# Patient Record
Sex: Female | Born: 1980
Health system: Southern US, Community
[De-identification: ages and names within clinical notes are randomized; demographics above are authoritative.]

## PROBLEM LIST (undated history)

## (undated) HISTORY — PX: OTHER SURGICAL HISTORY: SHX169

## (undated) HISTORY — PX: TONSILECTOMY/ADENOIDECTOMY WITH MYRINGOTOMY: SHX6125

---

## 2001-10-21 HISTORY — PX: ESOPHAGOGASTRODUODENOSCOPY: SHX1529

## 2013-01-05 HISTORY — PX: NASAL SINUS SURGERY: SHX719

## 2015-01-06 NOTE — L&D Delivery Note (Signed)
Delivery Note At 07:52 am a viable female, "[name]",was delivered via Vaginal, Spontaneous Delivery (Presentation: OA restituting to LOA). APGARS: 8, 8; weight pending.  Placenta status: Spontaneous, intact.Cord:with the following complications: Nuchal x 1; infant somersaulted through. Cord pH: NA  Anesthesia: Local for repair Episiotomy: NA Lacerations: Small 2nd degree vaginal Suture Repair: 3-0 chromic SH Est. Blood Loss (mL): 300  Mom to postpartum. Baby to Couplet care / Skin to Skin.  Mom plans to breastfeed.   Birth control not discussed.    Diana ScarletKimberly Macenzie Forbes, CNM 08/25/15, 8:58 AM

## 2015-07-31 LAB — OB RESULTS CONSOLE GBS: STREP GROUP B AG: POSITIVE

## 2015-08-25 ENCOUNTER — Encounter (HOSPITAL_COMMUNITY): Payer: Self-pay

## 2015-08-25 ENCOUNTER — Inpatient Hospital Stay (HOSPITAL_COMMUNITY)
Admission: AD | Admit: 2015-08-25 | Discharge: 2015-08-27 | DRG: 775 | Disposition: A | Discharge status: Home / Self Care | Payer: BLUE CROSS/BLUE SHIELD | Source: Ambulatory Visit | Attending: Obstetrics and Gynecology | Admitting: Obstetrics and Gynecology

## 2015-08-25 DIAGNOSIS — O09529 Supervision of elderly multigravida, unspecified trimester: Secondary | ICD-10-CM

## 2015-08-25 DIAGNOSIS — O99824 Streptococcus B carrier state complicating childbirth: Secondary | ICD-10-CM | POA: Diagnosis present

## 2015-08-25 DIAGNOSIS — Z6832 Body mass index (BMI) 32.0-32.9, adult: Secondary | ICD-10-CM | POA: Diagnosis not present

## 2015-08-25 DIAGNOSIS — Z3A39 39 weeks gestation of pregnancy: Secondary | ICD-10-CM

## 2015-08-25 DIAGNOSIS — E669 Obesity, unspecified: Secondary | ICD-10-CM | POA: Diagnosis present

## 2015-08-25 DIAGNOSIS — B951 Streptococcus, group B, as the cause of diseases classified elsewhere: Secondary | ICD-10-CM

## 2015-08-25 DIAGNOSIS — O99214 Obesity complicating childbirth: Secondary | ICD-10-CM | POA: Diagnosis present

## 2015-08-25 DIAGNOSIS — Z880 Allergy status to penicillin: Secondary | ICD-10-CM | POA: Diagnosis not present

## 2015-08-25 DIAGNOSIS — Z3483 Encounter for supervision of other normal pregnancy, third trimester: Secondary | ICD-10-CM | POA: Diagnosis present

## 2015-08-25 LAB — CBC
HEMATOCRIT: 37.5 % (ref 36.0–46.0)
Hemoglobin: 12.9 g/dL (ref 12.0–15.0)
MCH: 28.7 pg (ref 26.0–34.0)
MCHC: 34.4 g/dL (ref 30.0–36.0)
MCV: 83.5 fL (ref 78.0–100.0)
PLATELETS: 192 10*3/uL (ref 150–400)
RBC: 4.49 MIL/uL (ref 3.87–5.11)
RDW: 14.5 % (ref 11.5–15.5)
WBC: 11.7 10*3/uL — AB (ref 4.0–10.5)

## 2015-08-25 LAB — ABO/RH: ABO/RH(D): O POS

## 2015-08-25 LAB — TYPE AND SCREEN
ABO/RH(D): O POS
ANTIBODY SCREEN: NEGATIVE

## 2015-08-25 LAB — RPR: RPR Ser Ql: NONREACTIVE

## 2015-08-25 MED ORDER — OXYCODONE-ACETAMINOPHEN 5-325 MG PO TABS: 2.0000 | ORAL_TABLET | ORAL | Status: DC | PRN

## 2015-08-25 MED ORDER — LIDOCAINE HCL (PF) 1 % IJ SOLN
30.0000 mL | INTRAMUSCULAR | Status: AC | PRN
  Administered 2015-08-25: 30 mL via SUBCUTANEOUS
  Filled 2015-08-25: qty 30

## 2015-08-25 MED ORDER — MAGNESIUM HYDROXIDE 400 MG/5ML PO SUSP: 30.0000 mL | ORAL | Status: DC | PRN

## 2015-08-25 MED ORDER — METHYLERGONOVINE MALEATE 0.2 MG/ML IJ SOLN: 0.2000 mg | INTRAMUSCULAR | Status: DC | PRN

## 2015-08-25 MED ORDER — DIBUCAINE 1 % RE OINT: 1.0000 "application " | TOPICAL_OINTMENT | RECTAL | Status: DC | PRN

## 2015-08-25 MED ORDER — FLEET ENEMA 7-19 GM/118ML RE ENEM: 1.0000 | ENEMA | RECTAL | Status: DC | PRN

## 2015-08-25 MED ORDER — NALBUPHINE HCL 10 MG/ML IJ SOLN: 10.0000 mg | INTRAMUSCULAR | Status: DC | PRN

## 2015-08-25 MED ORDER — OXYCODONE-ACETAMINOPHEN 5-325 MG PO TABS: 1.0000 | ORAL_TABLET | ORAL | Status: DC | PRN

## 2015-08-25 MED ORDER — ONDANSETRON HCL 4 MG PO TABS
4.0000 mg | ORAL_TABLET | ORAL | Status: DC | PRN
Start: 2015-08-25 — End: 2015-08-27

## 2015-08-25 MED ORDER — OXYTOCIN 40 UNITS IN LACTATED RINGERS INFUSION - SIMPLE MED
2.5000 [IU]/h | INTRAVENOUS | Status: DC
  Filled 2015-08-25: qty 1000

## 2015-08-25 MED ORDER — ACETAMINOPHEN 325 MG PO TABS: 650.0000 mg | ORAL_TABLET | ORAL | Status: DC | PRN

## 2015-08-25 MED ORDER — VANCOMYCIN HCL IN DEXTROSE 1-5 GM/200ML-% IV SOLN
1000.0000 mg | Freq: Two times a day (BID) | INTRAVENOUS | Status: DC
  Administered 2015-08-25: 1000 mg via INTRAVENOUS
  Filled 2015-08-25: qty 200

## 2015-08-25 MED ORDER — ONDANSETRON HCL 4 MG/2ML IJ SOLN
4.0000 mg | INTRAMUSCULAR | Status: DC | PRN
Start: 2015-08-25 — End: 2015-08-27

## 2015-08-25 MED ORDER — TETANUS-DIPHTH-ACELL PERTUSSIS 5-2.5-18.5 LF-MCG/0.5 IM SUSP
0.5000 mL | Freq: Once | INTRAMUSCULAR | Status: AC
  Administered 2015-08-26: 0.5 mL via INTRAMUSCULAR
  Filled 2015-08-25: qty 0.5

## 2015-08-25 MED ORDER — BENZOCAINE-MENTHOL 20-0.5 % EX AERO
1.0000 "application " | INHALATION_SPRAY | CUTANEOUS | Status: DC | PRN
  Administered 2015-08-25: 1 via TOPICAL
  Filled 2015-08-25: qty 56

## 2015-08-25 MED ORDER — METHYLERGONOVINE MALEATE 0.2 MG PO TABS: 0.2000 mg | ORAL_TABLET | ORAL | Status: DC | PRN

## 2015-08-25 MED ORDER — OXYTOCIN BOLUS FROM INFUSION
500.0000 mL | Freq: Once | INTRAVENOUS | Status: AC
  Administered 2015-08-25: 500 mL via INTRAVENOUS

## 2015-08-25 MED ORDER — MEASLES, MUMPS & RUBELLA VAC ~~LOC~~ INJ
0.5000 mL | INJECTION | Freq: Once | SUBCUTANEOUS | Status: DC
  Filled 2015-08-25: qty 0.5

## 2015-08-25 MED ORDER — ZOLPIDEM TARTRATE 5 MG PO TABS: 5.0000 mg | ORAL_TABLET | Freq: Every evening | ORAL | Status: DC | PRN

## 2015-08-25 MED ORDER — DIPHENHYDRAMINE HCL 25 MG PO CAPS: 25.0000 mg | ORAL_CAPSULE | Freq: Four times a day (QID) | ORAL | Status: DC | PRN

## 2015-08-25 MED ORDER — SENNOSIDES-DOCUSATE SODIUM 8.6-50 MG PO TABS
2.0000 | ORAL_TABLET | ORAL | Status: DC
  Administered 2015-08-26 (×2): 2 via ORAL
  Filled 2015-08-25 (×2): qty 2

## 2015-08-25 MED ORDER — SOD CITRATE-CITRIC ACID 500-334 MG/5ML PO SOLN: 30.0000 mL | ORAL | Status: DC | PRN

## 2015-08-25 MED ORDER — ONDANSETRON HCL 4 MG/2ML IJ SOLN: 4.0000 mg | Freq: Four times a day (QID) | INTRAMUSCULAR | Status: DC | PRN

## 2015-08-25 MED ORDER — HYDROXYZINE HCL 50 MG PO TABS
50.0000 mg | ORAL_TABLET | Freq: Four times a day (QID) | ORAL | Status: DC | PRN
  Filled 2015-08-25: qty 1

## 2015-08-25 MED ORDER — PRENATAL MULTIVITAMIN CH
1.0000 | ORAL_TABLET | Freq: Every day | ORAL | Status: DC
  Administered 2015-08-25 – 2015-08-26 (×2): 1 via ORAL
  Filled 2015-08-25 (×2): qty 1

## 2015-08-25 MED ORDER — LACTATED RINGERS IV SOLN
INTRAVENOUS | Status: DC
  Administered 2015-08-25: 125 mL/h via INTRAVENOUS

## 2015-08-25 MED ORDER — COCONUT OIL OIL: 1.0000 "application " | TOPICAL_OIL | Status: DC | PRN

## 2015-08-25 MED ORDER — SIMETHICONE 80 MG PO CHEW: 80.0000 mg | CHEWABLE_TABLET | ORAL | Status: DC | PRN

## 2015-08-25 MED ORDER — OXYTOCIN 40 UNITS IN LACTATED RINGERS INFUSION - SIMPLE MED: 2.5000 [IU]/h | INTRAVENOUS | Status: DC | PRN

## 2015-08-25 MED ORDER — DOCUSATE SODIUM 100 MG PO CAPS
100.0000 mg | ORAL_CAPSULE | Freq: Two times a day (BID) | ORAL | Status: DC
  Administered 2015-08-26: 100 mg via ORAL
  Filled 2015-08-25 (×3): qty 1

## 2015-08-25 MED ORDER — LACTATED RINGERS IV SOLN: 500.0000 mL | INTRAVENOUS | Status: DC | PRN

## 2015-08-25 MED ORDER — WITCH HAZEL-GLYCERIN EX PADS: 1.0000 "application " | MEDICATED_PAD | CUTANEOUS | Status: DC | PRN

## 2015-08-25 MED ORDER — IBUPROFEN 600 MG PO TABS
600.0000 mg | ORAL_TABLET | Freq: Four times a day (QID) | ORAL | Status: DC
  Administered 2015-08-25 – 2015-08-27 (×8): 600 mg via ORAL
  Filled 2015-08-25 (×8): qty 1

## 2015-08-25 NOTE — H&P (Signed)
Diana SchwabMisty Su Forbes is a 35 y.o. female, G2P1001 at 39.6 wks (as dated by LMP c/w 20.0 wk us), presents w/ c/o ctxs since yesterday, about every 5 mins. Denies LOF. Some bloody mucous.  Pt entered care with [facility] at 8.4 wks, [facility]  Prenatal course significant for:  1) AMA. Declined genetic testing. 2) GBS positive. 3) PCN allergy - hives - Sensitive to Vancomycin, resistant to Clindamycin. 4) Partial placental previa - RESOLVED at 30 wks. 5) Obesity.   OB History    Gravida Para Term Preterm AB Living   2 1 1          SAB TAB Ectopic Multiple Live Births                 SVB on 12/26/2010 @ 40 wks; female infant, birthwt 7+5   History reviewed. No pertinent past medical history. Past Surgical History:  Procedure Laterality Date  . TONSILECTOMY/ADENOIDECTOMY WITH MYRINGOTOMY    . wisom teeth     Family History: family history is not on file. Social History:  reports that she has never smoked. She has never used smokeless tobacco. She reports that she does not drink alcohol or use drugs.     Maternal Diabetes: No Genetic Screening: Declined Maternal Ultrasounds/Referrals: Abnormal:  Findings:   Other:Partial placenta previa - RESOLVED AT 30 WKS Fetal Ultrasounds or other Referrals:  None Maternal Substance Abuse:  No Significant Maternal Medications:  Meds include: Other: Nystatin-triamcinolone, PNV, Probiotic, Vitamin D3, Vit K2 Significant Maternal Lab Results:  Lab values include: Group B Strep positive Other Comments:  Hbg 11.5 at 28 wks  ROS 10 Systems reviewed and are negative for acute change except as noted in the HPI.  History Dilation: 4.5 Effacement (%): 70 Station: -2, -1 Exam by:: Diana Enganielle Simpson RN @ 04:12  Blood pressure 130/88, pulse 90, temperature 98.2 F (36.8 C), temperature source Oral, resp. rate 16, height 5\' 5"  (1.651 m), weight 89.8 kg (198 lb), SpO2 99 %.   Exam  Cephalic by Leopold's EFW: 7 1/4 lbs; pelvis proven to  7+5  Physical Exam  Nursing noteand vitalsreviewed. Neurological: She has normal reflexes.  Constitutional: She is oriented to person, place, and time. She appears well-developed and well-nourished.  HENT:  Head: Normocephalic and atraumatic.  Neck: Normal range of motion.  Cardiovascular: Normal rate, regular rhythm and normal heart sounds.  Respiratory: Effort normal and breath sounds normal.  GI: Soft. Bowel sounds are normal. Abdomen is gravid.  Neurological: She is alert and oriented to person, place, and time.  Skin: Skin is warm and dry.  Musculoskeletal: Exhibits 1+ lower extremity edema. Psychiatric: She has a normal mood and affect. Her behavior is normal.   BL FHR 120s w/ moderate variability, +accels, no decels Toco: q 3-4 min  Results for orders placed or performed during the hospital encounter of 08/25/15 (from the past 24 hour(s))  CBC     Status: Abnormal   Collection Time: 08/25/15  4:25 AM  Result Value Ref Range   WBC 11.7 (H) 4.0 - 10.5 K/uL   RBC 4.49 3.87 - 5.11 MIL/uL   Hemoglobin 12.9 12.0 - 15.0 g/dL   HCT 62.937.5 52.836.0 - 41.346.0 %   MCV 83.5 78.0 - 100.0 fL   MCH 28.7 26.0 - 34.0 pg   MCHC 34.4 30.0 - 36.0 g/dL   RDW 24.414.5 01.011.5 - 27.215.5 %   Platelets 192 150 - 400 K/uL  Type and screen [facility]  Status: None   Collection Time: 08/25/15  4:25 AM  Result Value Ref Range   ABO/RH(D) O POS    Antibody Screen NEG    Sample Expiration 08/28/2015    Prenatal labs: ABO, Rh: --/--/O POS (08/20 0425) Antibody: NEG (08/20 0425) Rubella: Immune (01/18/2015) RPR: NR (01/18/2015) HBsAg: Neg (01/18/2015) HIV: Neg (01/18/2015) GBS: Positive (07/26 0000)   Assessment: IUP at 39.6 wks Latent phase labor FWB: Cat 1 GBS positive AMA Obesity (BMI 32.6)  [facility] Routine [facility] orders.  Expectant management for now. Vancomycin for GBS positive status. Pain med/epidural prn. Expect SVD.     [name],  [name] 08/25/2015, 5:57 AM

## 2015-08-25 NOTE — MAU Note (Signed)
Pt c/o contractions since yesterday about every 5 mins. Denies LOF. Some bloody mucous. +FM. Cervix has not been checked.

## 2015-08-25 NOTE — Anesthesia Pain Management Evaluation Note (Signed)
  CRNA Pain Management Visit Note  Patient: [name], 35 y.o., female  "Hello I am a [facility] the [facility]. We have an anesthesia team available at all times to provide care throughout the hospital, including epidural management and anesthesia for C-section. I don't know your plan for the delivery whether it a natural birth, water birth, IV sedation, nitrous supplementation, doula or epidural, but we want to meet your pain goals."   1.Was your pain managed to your expectations on prior hospitalizations?   Yes   2.What is your expectation for pain management during this hospitalization?     Nitrous Oxide  3.How can we help you reach that goal? N20  Record the patient's initial score and the patient's pain goal.   Pain: 6  Pain Goal: 9 [facility] wants you to be able to say your pain was always managed very well.  Shaheen Mende 08/25/2015

## 2015-08-26 LAB — CBC
HEMATOCRIT: 34.9 % — AB (ref 36.0–46.0)
HEMOGLOBIN: 11.7 g/dL — AB (ref 12.0–15.0)
MCH: 28.4 pg (ref 26.0–34.0)
MCHC: 33.5 g/dL (ref 30.0–36.0)
MCV: 84.7 fL (ref 78.0–100.0)
Platelets: 162 10*3/uL (ref 150–400)
RBC: 4.12 MIL/uL (ref 3.87–5.11)
RDW: 14.7 % (ref 11.5–15.5)
WBC: 13.5 10*3/uL — ABNORMAL HIGH (ref 4.0–10.5)

## 2015-08-26 NOTE — Lactation Note (Signed)
This note was copied from a baby's chart. Lactation Consultation Note Experienced BF mom BF her now 35 yr old for 13 months. Denies difficulty. States this baby is BF well. Latching well. Asked for tips on BF.  Patient Name: Girl Fredric MareMisty Beevers Mom encouraged to feed baby 8-12 times/24 hours and with feeding cues. Referred to Baby and Me Book in Breastfeeding section Pg. 22-23 for position options and Proper latch demonstration.Encouraged comfort during BF so colostrum flows better and mom will enjoy the feeding longer. Taking deep breaths and breast massage during BF.  Educated about newborn behavior, STS, I&O, cluster feeding. WH/LC brochure given w/resources, support groups and LC services. Today's Date: 08/26/2015 Reason for consult: Initial assessment   Maternal Data Has patient been taught Hand Expression?: Yes Does the patient have breastfeeding experience prior to this delivery?: Yes  Feeding Feeding Type: Breast Fed Length of feed: 20 min  LATCH Score/Interventions                Intervention(s): Breastfeeding basics reviewed;Support Pillows;Position options;Skin to skin     Lactation Tools Discussed/Used     Consult Status Consult Status: Follow-up Date: 08/27/15 Follow-up type: In-patient    Bert Ptacek, Diamond NickelLAURA G 08/26/2015, 2:05 AM

## 2015-08-26 NOTE — Progress Notes (Signed)
[name], [name] Female, 35 y.o., 01/26/1980  Post Partum Day1   Subjective: no complaints  Objective: Blood pressure 130/76, pulse 72, temperature 98.3 F (36.8 C), temperature source Oral, resp. rate 18, height 5\' 5"  (1.651 m), weight 89.8 kg (198 lb), SpO2 99 %, unknown if currently breastfeeding.  Physical Exam:  General: alert, cooperative and no distress Lochia: appropriate Uterine Fundus: firm DVT Evaluation: No evidence of DVT seen on physical exam. Calf/Ankle edema is present, 2+ in right foot, 1+ in left foot.    Recent Labs  08/25/15 0425 08/26/15 0529  HGB 12.9 11.7*  HCT 37.5 34.9*    Assessment/Plan: Plan for discharge tomorrow, Breastfeeding and Contraception plan is progesterone only pills.    LOS: 1 day   [facility] The[facility]UL[facility],Reyce [facility] 08/26/2015, 12:32 PM

## 2015-08-26 NOTE — Discharge Instructions (Signed)

## 2015-08-26 NOTE — Discharge Summary (Signed)
[facility]   Patient Name:   [name] DOB:     Sep 26, 1980 MRN:     [id number]  Date of Admission:   08/25/2015 Date of Discharge:  08/27/2015  Admitting diagnosis:    [id number] Principal Problem:   Vaginal delivery Active Problems:   Penicillin allergy - hives   Positive GBS test   AMA (advanced maternal age) multigravida 35+   Second-degree perineal laceration, with delivery      Discharge diagnosis:    [id number] Principal Problem:   Vaginal delivery Active Problems:   Penicillin allergy - hives   Positive GBS test   AMA (advanced maternal age) multigravida 35+   Second-degree perineal laceration, with delivery                                                                       Post partum procedures: NA  Type of Delivery:  SVB  Delivering Provider: Sherre ScarletWILLIAMS, KIMBERLY   Date of Delivery:  08/25/15  Newborn Data:    Live born female  Birth Weight: 7 lb (3175 g) APGAR: 8, 8  Baby's Name:  [name] Baby Feeding:   Breast Disposition:   home with mother  Complications:   None  Hospital course:      Onset of Labor With Vaginal Delivery     35 y.o. yo G2P2001 at 7451w6d was admitted in Latent Labor on 08/25/2015. Patient had an uncomplicated labor course as follows:  Membrane Rupture Time/Date: 7:36 AM ,08/25/2015   Intrapartum Procedures: Episiotomy: None [1]                                         Lacerations:  2nd degree [3];Perineal [11]  Patient had a delivery of a Viable infant. 08/25/2015  Information for the patient's newborn:  [name] [[id number]][[id number]]  Delivery Method: Vaginal, Spontaneous Delivery (Filed from Delivery Summary)    Pateint had an uncomplicated postpartum course.  She is ambulating, tolerating a regular diet, passing flatus, and urinating well. Patient is discharged home in stable condition on 08/27/15.    Physical Exam:   Vitals:   08/26/15 0500 08/26/15 1739  BP: 130/76 124/76   Pulse: 72 72  Resp: 18 18  Temp:  98.1 F (36.7 C)    General: alert Lochia: appropriate Uterine Fundus: firm Incision: Healing well with no significant drainage DVT Evaluation: No evidence of DVT seen on physical exam. Negative Homan's sign.  Labs:  CBC Latest Ref Rng & Units 08/26/2015 08/25/2015  WBC 4.0 - 10.5 K/uL 13.5(H) 11.7(H)  Hemoglobin 12.0 - 15.0 g/dL 11.7(L) 12.9  Hematocrit 36.0 - 46.0 % 34.9(L) 37.5  Platelets 150 - 400 K/uL 162 192     Discharge instruction: per After Visit Summary and "Baby and Me Booklet".  After Visit Meds:    Medication List    TAKE these medications   ibuprofen 600 MG tablet Commonly known as:  ADVIL,MOTRIN Take 1 tablet (600 mg total) by mouth every 6 (six) hours as needed.   norethindrone 0.35 MG tablet Commonly known as:  ORTHO MICRONOR Take 1 tablet (0.35 mg total) by mouth daily.  Start taking on:  09/15/2015   prenatal multivitamin Tabs tablet Take 1 tablet by mouth daily at 12 noon.       Diet: routine diet  Activity: Advance as tolerated. Pelvic rest for 6 weeks.   Outpatient follow up:6 weeks Follow up Appt:No future appointments. Follow up visit: No Follow-up on file.  Postpartum contraception: Progesterone only pills and Combination OCPs  08/27/2015 [name], CNM

## 2015-08-26 NOTE — Lactation Note (Signed)
This note was copied from a baby's chart. Lactation Consultation Note Follow up visit at 36 hours.  Mom is latching baby now to left breast in cross cradle hold.  Lc assisted with flanging lower lip and mom reports improved comfort.  Baby has fed very frequently and mom is a little sore.  No trauma noted to nipples at this time.  Audible swallows noted during entire feeding, encouraged mom to break latch when baby was not maintaining sucking and more relaxed.  Nipple slightly pinched with compression stripe on tip.  Nipple is not bruised at this time.  LC encouraged mom to work on breaking suction better and not allowing baby to pull nipple.  Baby then laid on moms chest asleep.  Baby is feeding well and may need feedings limited to active feeding time to prevent further soreness. Mom is hand expressing and applying to nipples.   Mom will have 16 weeks leave before returning to work.  Encouraged on demand feedings to establish a good supply and the pumping to have supply later.  Mom reports using fenugreek and waiting to pump until closer to returning to work with her older child.  Mom denies further concerns at this time.  FOB at bedside supportive.    Patient Name: [name] ZOXWR'UToday's Date: 08/26/2015 Reason for consult: Follow-up assessment   Maternal Data    Feeding Feeding Type: Breast Fed Length of feed: 7 min  LATCH Score/Interventions Latch: Grasps breast easily, tongue down, lips flanged, rhythmical sucking. Intervention(s): Assist with latch;Breast compression  Audible Swallowing: Spontaneous and intermittent Intervention(s): Skin to skin  Type of Nipple: Everted at rest and after stimulation  Comfort (Breast/Nipple): Filling, red/small blisters or bruises, mild/mod discomfort  Problem noted: Mild/Moderate discomfort Interventions (Mild/moderate discomfort): Hand expression  Hold (Positioning): No assistance needed to correctly position infant at  breast. Intervention(s): Breastfeeding basics reviewed;Skin to skin  LATCH Score: 9  Lactation Tools Discussed/Used     Consult Status Consult Status: Follow-up Date: 08/27/15 Follow-up type: In-patient    [name], [name] 08/26/2015, 8:31 PM

## 2015-08-27 MED ORDER — NORETHINDRONE 0.35 MG PO TABS: 1.0000 | ORAL_TABLET | Freq: Every day | ORAL | 11 refills | Status: DC

## 2015-08-27 MED ORDER — IBUPROFEN 600 MG PO TABS: 600.0000 mg | ORAL_TABLET | Freq: Four times a day (QID) | ORAL | 2 refills | Status: DC | PRN

## 2015-08-27 NOTE — Lactation Note (Signed)
This note was copied from a baby's chart. Lactation Consultation Note: Experienced BF mom reports baby has been nursing well. Concerned about weight loss. Reassurance given. Reports breasts are feeling fuller this morning. Baby last fed about 1 hour ago for 25 min and is asleep on mom's chest. Has Medela pump for home. Back to work after 3 months. Encouragement given. Reviewed our phone number to call with questions. OP appointments and BFSG .   Patient Name: [name] NFAOZ'HToday's Date: 08/27/2015 Reason for consult: Follow-up assessment   Maternal Data Formula Feeding for Exclusion: No Has patient been taught Hand Expression?: Yes Does the patient have breastfeeding experience prior to this delivery?: Yes  Feeding Feeding Type: Breast Fed Length of feed: 25 min  LATCH Score/Interventions                      Lactation Tools Discussed/Used WIC Program: No   Consult Status Consult Status: Complete    Pamelia HoitWeeks, Maguire Killmer D 08/27/2015, 8:20 AM

## 2017-04-12 ENCOUNTER — Emergency Department (HOSPITAL_BASED_OUTPATIENT_CLINIC_OR_DEPARTMENT_OTHER): Payer: BLUE CROSS/BLUE SHIELD

## 2017-04-12 ENCOUNTER — Emergency Department (HOSPITAL_BASED_OUTPATIENT_CLINIC_OR_DEPARTMENT_OTHER)
Admission: EM | Admit: 2017-04-12 | Discharge: 2017-04-12 | Disposition: A | Discharge status: Home / Self Care | Payer: BLUE CROSS/BLUE SHIELD | Attending: Emergency Medicine | Admitting: Emergency Medicine

## 2017-04-12 ENCOUNTER — Encounter (HOSPITAL_BASED_OUTPATIENT_CLINIC_OR_DEPARTMENT_OTHER): Payer: Self-pay | Admitting: *Deleted

## 2017-04-12 ENCOUNTER — Other Ambulatory Visit: Payer: Self-pay

## 2017-04-12 DIAGNOSIS — D72829 Elevated white blood cell count, unspecified: Secondary | ICD-10-CM | POA: Insufficient documentation

## 2017-04-12 DIAGNOSIS — K7689 Other specified diseases of liver: Secondary | ICD-10-CM | POA: Insufficient documentation

## 2017-04-12 DIAGNOSIS — R109 Unspecified abdominal pain: Secondary | ICD-10-CM | POA: Diagnosis not present

## 2017-04-12 DIAGNOSIS — R197 Diarrhea, unspecified: Secondary | ICD-10-CM | POA: Diagnosis not present

## 2017-04-12 DIAGNOSIS — R1013 Epigastric pain: Secondary | ICD-10-CM | POA: Diagnosis present

## 2017-04-12 DIAGNOSIS — K824 Cholesterolosis of gallbladder: Secondary | ICD-10-CM | POA: Diagnosis not present

## 2017-04-12 DIAGNOSIS — Z79899 Other long term (current) drug therapy: Secondary | ICD-10-CM | POA: Insufficient documentation

## 2017-04-12 LAB — COMPREHENSIVE METABOLIC PANEL
ALT: 13 U/L — AB (ref 14–54)
AST: 16 U/L (ref 15–41)
Albumin: 4 g/dL (ref 3.5–5.0)
Alkaline Phosphatase: 84 U/L (ref 38–126)
Anion gap: 7 (ref 5–15)
BILIRUBIN TOTAL: 0.5 mg/dL (ref 0.3–1.2)
BUN: 11 mg/dL (ref 6–20)
CO2: 25 mmol/L (ref 22–32)
CREATININE: 0.67 mg/dL (ref 0.44–1.00)
Calcium: 8.9 mg/dL (ref 8.9–10.3)
Chloride: 107 mmol/L (ref 101–111)
GFR calc Af Amer: >60 mL/min (ref >60)
Glucose, Bld: 99 mg/dL (ref 65–99)
POTASSIUM: 3.6 mmol/L (ref 3.5–5.1)
Sodium: 139 mmol/L (ref 135–145)
TOTAL PROTEIN: 6.5 g/dL (ref 6.5–8.1)

## 2017-04-12 LAB — CBC WITH DIFFERENTIAL/PLATELET
Basophils Absolute: 0 10*3/uL (ref 0.0–0.1)
Basophils Relative: 0 %
Eosinophils Absolute: 5.1 10*3/uL — ABNORMAL HIGH (ref 0.0–0.7)
Eosinophils Relative: 40 %
HEMATOCRIT: 39.1 % (ref 36.0–46.0)
HEMOGLOBIN: 13.3 g/dL (ref 12.0–15.0)
LYMPHS ABS: 2.8 10*3/uL (ref 0.7–4.0)
LYMPHS PCT: 22 %
MCH: 29.4 pg (ref 26.0–34.0)
MCHC: 34 g/dL (ref 30.0–36.0)
MCV: 86.3 fL (ref 78.0–100.0)
MONOS PCT: 6 %
Monocytes Absolute: 0.8 10*3/uL (ref 0.1–1.0)
NEUTROS ABS: 4.1 10*3/uL (ref 1.7–7.7)
Neutrophils Relative %: 32 %
Platelets: 238 10*3/uL (ref 150–400)
RBC: 4.53 MIL/uL (ref 3.87–5.11)
RDW: 13.1 % (ref 11.5–15.5)
WBC: 12.8 10*3/uL — ABNORMAL HIGH (ref 4.0–10.5)

## 2017-04-12 LAB — URINALYSIS, ROUTINE W REFLEX MICROSCOPIC
BILIRUBIN URINE: NEGATIVE
GLUCOSE, UA: NEGATIVE mg/dL
HGB URINE DIPSTICK: NEGATIVE
Ketones, ur: NEGATIVE mg/dL
Leukocytes, UA: NEGATIVE
Nitrite: NEGATIVE
PH: 6 (ref 5.0–8.0)
Protein, ur: NEGATIVE mg/dL

## 2017-04-12 LAB — LIPASE, BLOOD: LIPASE: 23 U/L (ref 11–51)

## 2017-04-12 LAB — TROPONIN I: Troponin I: <0.03 ng/mL (ref <0.03)

## 2017-04-12 LAB — OCCULT BLOOD X 1 CARD TO LAB, STOOL: Fecal Occult Bld: NEGATIVE

## 2017-04-12 LAB — PREGNANCY, URINE: Preg Test, Ur: NEGATIVE

## 2017-04-12 MED ORDER — GI COCKTAIL ~~LOC~~
30.0000 mL | Freq: Once | ORAL | Status: AC
  Administered 2017-04-12: 30 mL via ORAL
  Filled 2017-04-12: qty 30

## 2017-04-12 MED ORDER — FAMOTIDINE IN NACL 20-0.9 MG/50ML-% IV SOLN
20.0000 mg | Freq: Once | INTRAVENOUS | Status: AC
  Administered 2017-04-12: 20 mg via INTRAVENOUS
  Filled 2017-04-12: qty 50

## 2017-04-12 MED ORDER — PANTOPRAZOLE SODIUM 40 MG PO TBEC: 40.0000 mg | DELAYED_RELEASE_TABLET | Freq: Every day | ORAL | 0 refills | Status: DC

## 2017-04-12 MED ORDER — SUCRALFATE 1 GM/10ML PO SUSP: 1.0000 g | Freq: Three times a day (TID) | ORAL | 0 refills | Status: DC

## 2017-04-12 MED FILL — PANTOPRAZOLE SOD DR 40 MG T: 40 | 30 days supply | Qty: 30 | Fill #0

## 2017-04-12 MED FILL — CARAFATE 1 GM/10 ML SUSP: 1 | 11 days supply | Qty: 420 | Fill #0

## 2017-04-12 NOTE — ED Provider Notes (Signed)
[facility] EMERGENCY DEPARTMENT Provider Note   CSN: [id number] Arrival date & time: 04/12/17  1131   History   Chief Complaint Chief Complaint  Patient presents with  . Abdominal Pain    HPI [name] is a 37 y.o. female who presents to the emergency department complaining of epigastric pain over the past 4 weeks.  Patient states that she has had discomfort in the epigastric region that is constant, it is significantly worsened by eating or drinking.  States that when she consumes any type of food or liquid she has increase in her pain and feels as if it stabbing. Has no specific alleviating factors.  States she has had diarrhea and loose stools over the past 4 weeks as well.  She has tried eliminating things from her diet participating in the brat diet without significant relief.  She has not tried any medications.  States this feels somewhat similar to when she was told that she had an early stage of an ulcer after an EGD in college, she was treated with antiacid and antibiotic with resolution and has not had recurrence.  Denies fever, chills, nausea, vomiting, blood in her stool, chest pain, or dyspnea.  No recent foreign travel or recent antibiotic use.  No increase in NSAIDs or alcohol intake. LMP 04/03- patient has IUD in place.   HPI  History reviewed. No pertinent past medical history.  Patient Active Problem List   Diagnosis Date Noted  . Penicillin allergy - hives 08/25/2015  . Positive GBS test 08/25/2015  . AMA (advanced maternal age) multigravida 35+ 08/25/2015  . Vaginal delivery 08/25/2015  . Second-degree perineal laceration, with delivery 08/25/2015    Past Surgical History:  Procedure Laterality Date  . TONSILECTOMY/ADENOIDECTOMY WITH MYRINGOTOMY    . wisom teeth       OB History    Gravida  2   Para  2   Term  2   Preterm      AB      Living  1     SAB      TAB      Ectopic      Multiple  0   Live Births  1             Home Medications    Prior to Admission medications   Medication Sig Start Date End Date Taking? Authorizing Provider  ibuprofen (ADVIL,MOTRIN) 600 MG tablet Take 1 tablet (600 mg total) by mouth every 6 (six) hours as needed. 08/27/15   Nigel Bridgeman, CNM  norethindrone (ORTHO MICRONOR) 0.35 MG tablet Take 1 tablet (0.35 mg total) by mouth daily. 09/15/15   Nigel Bridgeman, CNM  Prenatal Vit-Fe Fumarate-FA (PRENATAL MULTIVITAMIN) TABS tablet Take 1 tablet by mouth daily at 12 noon.     [provider]    Family History History reviewed. No pertinent family history.  Social History Social History   Tobacco Use  . Smoking status: Never Smoker  . Smokeless tobacco: Never Used  Substance Use Topics  . Alcohol use: No  . Drug use: No     Allergies   Penicillins   Review of Systems Review of Systems  Constitutional: Negative for chills and fever.  Respiratory: Negative for shortness of breath.   Cardiovascular: Negative for chest pain, palpitations and leg swelling.  Gastrointestinal: Positive for abdominal pain and diarrhea. Negative for blood in stool, constipation, nausea and vomiting.  Genitourinary: Negative for dysuria, vaginal bleeding and vaginal discharge.  All other  systems reviewed and are negative.    Physical Exam Updated Vital Signs BP (!) 128/100   Pulse 85   Temp 99 F (37.2 C) (Oral)   Resp 20   Ht 5\' 5"  (1.651 m)   Wt 72.6 kg (160 lb)   SpO2 99%   BMI 26.63 kg/m   Physical Exam  Constitutional: She appears well-developed and well-nourished.  Non-toxic appearance.  HENT:  Head: Normocephalic and atraumatic.  Eyes: Conjunctivae are normal. Right eye exhibits no discharge. Left eye exhibits no discharge. No scleral icterus.  Cardiovascular: Normal rate and regular rhythm.  No murmur heard. Pulmonary/Chest: Breath sounds normal. No respiratory distress. She has no wheezes. She has no rales.  Abdominal: Soft. Bowel sounds are normal. She  exhibits no distension. There is no hepatosplenomegaly. There is no tenderness. There is no rigidity, no rebound, no guarding, no CVA tenderness, no tenderness at McBurney's point and negative Murphy's sign.  Neurological: She is alert.  Clear speech.   Skin: Skin is warm and dry. No rash noted.  Psychiatric: She has a normal mood and affect. Her behavior is normal.  Nursing note and vitals reviewed.   ED Treatments / Results  Labs Results for orders placed or performed during the hospital encounter of 04/12/17  Comprehensive metabolic panel  Result Value Ref Range   Sodium 139 135 - 145 mmol/L   Potassium 3.6 3.5 - 5.1 mmol/L   Chloride 107 101 - 111 mmol/L   CO2 25 22 - 32 mmol/L   Glucose, Bld 99 65 - 99 mg/dL   BUN 11 6 - 20 mg/dL   Creatinine, Ser 1.61 0.44 - 1.00 mg/dL   Calcium 8.9 8.9 - 09.6 mg/dL   Total Protein 6.5 6.5 - 8.1 g/dL   Albumin 4.0 3.5 - 5.0 g/dL   AST 16 15 - 41 U/L   ALT 13 (L) 14 - 54 U/L   Alkaline Phosphatase 84 38 - 126 U/L   Total Bilirubin 0.5 0.3 - 1.2 mg/dL   GFR calc non Af Amer >60 >60 mL/min   GFR calc Af Amer >60 >60 mL/min   Anion gap 7 5 - 15  Lipase, blood  Result Value Ref Range   Lipase 23 11 - 51 U/L  CBC with Diff  Result Value Ref Range   WBC 12.8 (H) 4.0 - 10.5 K/uL   RBC 4.53 3.87 - 5.11 MIL/uL   Hemoglobin 13.3 12.0 - 15.0 g/dL   HCT 04.5 40.9 - 81.1 %   MCV 86.3 78.0 - 100.0 fL   MCH 29.4 26.0 - 34.0 pg   MCHC 34.0 30.0 - 36.0 g/dL   RDW 91.4 78.2 - 95.6 %   Platelets 238 150 - 400 K/uL   Neutrophils Relative % 32 %   Lymphocytes Relative 22 %   Monocytes Relative 6 %   Eosinophils Relative 40 %   Basophils Relative 0 %   Neutro Abs 4.1 1.7 - 7.7 K/uL   Lymphs Abs 2.8 0.7 - 4.0 K/uL   Monocytes Absolute 0.8 0.1 - 1.0 K/uL   Eosinophils Absolute 5.1 (H) 0.0 - 0.7 K/uL   Basophils Absolute 0.0 0.0 - 0.1 K/uL   Smear Review MORPHOLOGY UNREMARKABLE   Urinalysis, Routine w reflex microscopic  Result Value Ref Range    Color, Urine STRAW (A) YELLOW   APPearance CLEAR CLEAR   Specific Gravity, Urine <1.005 (L) 1.005 - 1.030   pH 6.0 5.0 - 8.0   Glucose, UA NEGATIVE  NEGATIVE mg/dL   Hgb urine dipstick NEGATIVE NEGATIVE   Bilirubin Urine NEGATIVE NEGATIVE   Ketones, ur NEGATIVE NEGATIVE mg/dL   Protein, ur NEGATIVE NEGATIVE mg/dL   Nitrite NEGATIVE NEGATIVE   Leukocytes, UA NEGATIVE NEGATIVE  Pregnancy, urine  Result Value Ref Range   Preg Test, Ur NEGATIVE NEGATIVE  Occult blood card to lab, stool Provider will collect  Result Value Ref Range   Fecal Occult Bld NEGATIVE NEGATIVE  Troponin I  Result Value Ref Range   Troponin I <0.03 <0.03 ng/mL   No results found. EKG EKG Interpretation  Date/Time:  Monday April 12 2017 11:59:23 EDT Ventricular Rate:  75 PR Interval:    QRS Duration: 96 QT Interval:  392 QTC Calculation: 438 R Axis:   53 Text Interpretation:  Sinus rhythm Borderline short PR interval Low voltage, extremity leads No old tracing to compare Confirmed by Raeford RazorKohut, Stephen [id number]) on 04/12/2017 4:35:42 PM   Radiology [place] Abdomen Limited Ruq  Result Date: 04/12/2017 CLINICAL DATA:  Epigastric pain for several weeks EXAM: ULTRASOUND ABDOMEN LIMITED RIGHT UPPER QUADRANT COMPARISON:  None. FINDINGS: Gallbladder: Gallstone is well distended. Tiny stone is noted in the region of the gallbladder neck without complicating factors. Small polyp is also noted. Common bile duct: Diameter: Mm. Liver: No focal lesion identified. Within normal limits in parenchymal echogenicity. Small cyst is noted in the left lobe measuring less than 1 cm. Portal vein is patent on color Doppler imaging with normal direction of blood flow towards the liver. IMPRESSION: Tiny gallstone in the region of the neck without complicating factors. Small gallbladder polyp is noted as well. Small hepatic cyst. Electronically Signed   By: Alcide CleverMark  Lukens M.D.   On: 04/12/2017 16:56    Procedures Procedures (including critical care  time)  Medications Ordered in ED Medications - No data to display   Initial Impression / Assessment and Plan / ED Course  I have reviewed the triage vital signs and the nursing notes.  Pertinent labs & imaging results that were available during my care of the patient were reviewed by me and considered in my medical decision making (see chart for details).   Patient presents with complaint of epigastric pain. Patient is nontoxic appearing, noted to have mildly elevated diastolic blood pressure initially, this normalized. Patient has a benign exam, no peritoneal signs. Will initiate care with basic lab work, EKG, and treat with pepcid.   Lab work fairly unremarkable. Patient has nonspecific leukocytosis at 12.8. No anemia. No significant electrolyte abnormalities. Urine does not appear infectious. Renal function and LFTs are without significant abnormalities. Lipase WNL. Hemoccult negative. Given pain in epigastric region troponin and EKG obtained- troponin WNL after 4 weeks of discomfort, EKG without obvious ischemia, doubt ACS. Patient with mild improvement following pepcid, will obtain RUQ US to evaluate for cholecystitis and treat with GI cocktail.  RUQ US without evidence of acute cholecystitis, there is a tiny gallstone without complicating factors as well as a small gallbladder polyp and small hepatic cyst.   17:00: RE-EVAL: Patient feeling much improved following GI cocktail. She is tolerating PO. Repeat abdominal exam remains nontender, no peritoneal signs.   Patient is nontoxic, nonseptic appearing, in no apparent distress.  Labs, imaging and vitals reviewed.  Patient does not meet the SIRS or Sepsis criteria.  On repeat exam patient does not have a surgical abdomen and there are no peritoneal signs.  No indication of appendicitis, bowel obstruction, bowel perforation, cholecystitis, diverticulitis, ectopic pregnancy, or pancreatitis. Patient  H&P suspicious for possible gastric ulcer  disease, hemoglobin is stable and hemoccult is negative therefore doubt GI bleed. Would also consider symptomatic cholelithiasis, however given history and physical and improvement with GI cocktail this is much lower on differential. Will start patient on Protonix and Carafate with GI follow up. I discussed results, treatment plan, need for GI follow-up, and return precautions with the patient. Provided opportunity for questions, patient confirmed understanding is in agreement with plan.   Final Clinical Impressions(s) / ED Diagnoses   Final diagnoses:  Abdominal pain  Epigastric pain    ED Discharge Orders        Ordered    pantoprazole (PROTONIX) 40 MG tablet  Daily     04/12/17 1721    sucralfate (CARAFATE) 1 GM/10ML suspension  3 times daily with meals & bedtime     04/12/17 [address], [place], PA-C 04/12/17 1931    Raeford Razor, MD 04/13/17 [phone]

## 2017-04-12 NOTE — ED Notes (Signed)
ED Provider at bedside. 

## 2017-04-12 NOTE — ED Notes (Signed)
Pt stated that she feels bloated.

## 2017-04-12 NOTE — ED Triage Notes (Signed)
Epigastric pain 4 weeks. States it feels like an ulcer.

## 2017-04-12 NOTE — Discharge Instructions (Addendum)
You were seen in the emergency department today for abdominal pain.  Your lab work was all reassuring.  The enzyme we used to check your pancreas was normal.  Your liver function and kidney function are without significant abnormalities.  Your urine showed that you are well-hydrated.  The enzyme we used to check your heart was normal.  Your EKG looked reassuring.  There was no blood in the stool sample that was obtained.  Your white blood cell count which is a marker for infection was slightly elevated, this can be due to infection, stress, or trauma.  Ultrasound of your gallbladder did show a small gallstone in the neck of your gallbladder-it did not show that the gallbladder was inflamed.  We are unsure of the exact cause of your symptoms at this time.  We are highly considering gastritis and/or gastric ulcer disease.  We are treating her symptoms with a proton pump inhibitor- Protonix and another anti-acid medication Carafate.   - Pantoprazole (Protonix)- take this once each morning prior to eating anything.  - Carafate- Take this prior to meals and prior to bed.   We have prescribed you new medication(s) today. Discuss the medications prescribed today with your pharmacist as they can have adverse effects and interactions with your other medicines including over the counter and prescribed medications. Seek medical evaluation if you start to experience new or abnormal symptoms after taking one of these medicines, seek care immediately if you start to experience difficulty breathing, feeling of your throat closing, facial swelling, or rash as these could be indications of a more serious allergic reaction   Call the GI doctor in your discharge instructions tomorrow morning in order to make an appointment within the next 3 days for reevaluation and further management of your symptoms.  Return to the emergency department at any time for any new or worsening symptoms including but not limited to fever,  worsening pain, changing her skin color, inability to keep down fluids or food, blood in your stool, or any other concerns that you may have.

## 2017-04-13 ENCOUNTER — Encounter: Payer: Self-pay | Admitting: Gastroenterology

## 2017-04-20 ENCOUNTER — Encounter: Payer: Self-pay | Admitting: Gastroenterology

## 2017-04-20 ENCOUNTER — Ambulatory Visit (INDEPENDENT_AMBULATORY_CARE_PROVIDER_SITE_OTHER): Payer: BLUE CROSS/BLUE SHIELD | Admitting: Gastroenterology

## 2017-04-20 VITALS — BP 104/72 | HR 66 | Ht 66.0 in | Wt 160.2 lb

## 2017-04-20 DIAGNOSIS — R1013 Epigastric pain: Secondary | ICD-10-CM

## 2017-04-20 DIAGNOSIS — G8929 Other chronic pain: Secondary | ICD-10-CM | POA: Diagnosis not present

## 2017-04-20 DIAGNOSIS — K580 Irritable bowel syndrome with diarrhea: Secondary | ICD-10-CM

## 2017-04-20 MED ORDER — PANTOPRAZOLE SODIUM 40 MG PO TBEC: 40.0000 mg | DELAYED_RELEASE_TABLET | Freq: Every day | ORAL | 2 refills | Status: DC

## 2017-04-20 MED ORDER — DICYCLOMINE HCL 10 MG PO CAPS: 10.0000 mg | ORAL_CAPSULE | Freq: Two times a day (BID) | ORAL | 6 refills | Status: AC

## 2017-04-20 NOTE — Patient Instructions (Addendum)
If you are age 38 or older, your body mass index should be between 23-30. Your Body mass index is 25.87 kg/m. If this is out of the aforementioned range listed, please consider follow up with your Primary Care Provider.  If you are age 38 or younger, your body mass index should be between 19-25. Your Body mass index is 25.87 kg/m. If this is out of the aformentioned range listed, please consider follow up with your Primary Care Provider.   We have sent the following medications to your pharmacy for you to pick up at your convenience: Protonix 40 mg once daily  Dicyclomine 10 mg twice daily  Please call in 4 weeks and let us know how you are doing.   Thank you,  Dr. Lynann Bolognaajesh Gupta

## 2017-04-20 NOTE — Progress Notes (Signed)
Chief Complaint: Abdominal pain, history of diarrhea  Referring Provider:   ED and Dr Lelon Perla     ASSESSMENT AND PLAN;  1. Epigastric pain -  [place] did show a small gallstone in the neck.  Normal CBC, CMP, lipase.  Abdominal pain has resolved on Protonix. 2.  IBS with predominant diarrhea.  No red flag symptoms. Neg stool studies in the past.  Plan: -Have reviewed labs and notes from the emergency room visit.  Patient does not want to get cholecystectomy performed.  I am also not convinced that she had biliary colic.  She did respond well to Protonix.  We will continue Protonix 40 mg p.o. once a day for 12 weeks.  She has history of peptic ulcer disease in the past with similar presentation.  She would also like to avoid EGD if possible. -Bentyl 10 mg p.o. twice daily half an hour before lunch and at bedtime. -She will call us in 4 weeks to let us know how she is doing.  Certainly earlier if she has problems. -Follow-up in 12 weeks.  -If she still has problems, with check TSH, celiac screen, repeat CBC CMP and lipase.     HPI:    [name] is 37 y.o. female  with epigastric pain over the last 4 weeks, exacerbated by eating or drinking.  She does admit that salads would make the pain worse.  She did not have any nausea/vomiting.  There is no jaundice dark urine or pale stools.    She has long-standing history of diarrhea over the last 15 to 20 years especially after eating.  There are no red flag symptoms.    She had similar presentation when she was at [facility].  She had undergone EGD which revealed gastritis /gastric erosions.  She was treated with PPIs at that point.    In the emergency room , she had normal CBCs except for elevated WBC count , normal CMP and lipase.  Ultrasound showed questionable small stone in the gallbladder neck.  Patient does not want to undergo cholecystectomy. "I would rather keep all my body parts".   She has been under considerable stress lately -due to her  husband just had rotator cuff surgery, young kids.  She has been doing well.  No melena or hematochezia.  No weight loss.  History reviewed. No pertinent past medical history.   Past Surgical History:  Procedure Laterality Date  . TONSILECTOMY/ADENOIDECTOMY WITH MYRINGOTOMY    . wisom teeth     Family History  Problem Relation Age of Onset  . Colon polyps Mother   . Colon polyps Father    Social History   Tobacco Use  . Smoking status: Never Smoker  . Smokeless tobacco: Never Used  Substance Use Topics  . Alcohol use: No  . Drug use: No   Current Outpatient Medications  Medication Sig Dispense Refill  . guaiFENesin (MUCOSA PO) Take by mouth.    . pantoprazole (PROTONIX) 40 MG tablet Take 1 tablet (40 mg total) by mouth daily. 30 tablet 0  . Prenatal Vit-Fe Fumarate-FA (PRENATAL MULTIVITAMIN) TABS tablet Take 1 tablet by mouth daily at 12 noon.     . Probiotic Product (PROBIOTIC ADVANCED PO) Probiotic 20 billion cell capsule   1 capsule every day by oral route.    . sucralfate (CARAFATE) 1 GM/10ML suspension Take 10 mLs (1 g total) by mouth 4 (four) times daily -  with meals and at bedtime. 420 mL 0  . VITAMIN  K PO Take 40 mcg by mouth daily.     No current facility-administered medications for this visit.    Allergies  Allergen Reactions  . Penicillin G Hives  . Penicillins Rash    Has patient had a PCN reaction causing immediate rash, facial/tongue/throat swelling, SOB or lightheadedness with hypotension: No Has patient had a PCN reaction causing severe rash involving mucus membranes or skin necrosis: No Has patient had a PCN reaction that required hospitalization No Has patient had a PCN reaction occurring within the last 10 years: No If all of the above answers are "NO", then may proceed with Cephalosporin use.     Review of Systems:  Constitutional: Denies fever, chills, diaphoresis, appetite change and fatigue.  HEENT: Denies photophobia, eye pain, redness,  hearing loss, ear pain, congestion, sore throat, rhinorrhea, sneezing, mouth sores, neck pain, neck stiffness and tinnitus.   Respiratory: Denies SOB, DOE, cough, chest tightness,  and wheezing.   Cardiovascular: Denies chest pain, palpitations and leg swelling.  Genitourinary: Denies dysuria, urgency, frequency, hematuria, flank pain and difficulty urinating.  Musculoskeletal: Denies myalgias, back pain, joint swelling, arthralgias and gait problem.  Skin: No rash. .  Neurological: Denies dizziness, seizures, syncope, weakness, light-headedness, numbness and headaches.  Hematological: Denies adenopathy. Easy bruising, personal or family bleeding history  Psychiatric/Behavioral: No anxiety or depression     Physical Exam:    There were no vitals taken for this visit. Constitutional:  Well-developed, in no acute distress. Psychiatric: Normal mood and affect. Behavior is normal. HEENT: Pupils normal.  Conjunctivae are normal. No scleral icterus. Neck supple.  Cardiovascular: Normal rate, regular rhythm. No edema Pulmonary/chest: Effort normal and breath sounds normal. No wheezing, rales or rhonchi. Abdominal: Soft, nondistended. Nontender. Bowel sounds active throughout. There are no masses palpable. No hepatomegaly. Rectal:    Neurological: Alert and oriented to person place and time. Skin: Skin is warm and dry. No rashes noted.   Lynann Bolognaajesh Kindell Strada, MD   Jonnie KindSaunders, Weston W, MD

## 2017-05-14 ENCOUNTER — Ambulatory Visit: Payer: BLUE CROSS/BLUE SHIELD | Admitting: Gastroenterology

## 2017-08-05 ENCOUNTER — Other Ambulatory Visit: Payer: Self-pay | Admitting: Gastroenterology

## 2017-08-24 ENCOUNTER — Ambulatory Visit: Payer: BLUE CROSS/BLUE SHIELD | Admitting: Gastroenterology

## 2017-09-03 ENCOUNTER — Encounter: Payer: Self-pay | Admitting: Gastroenterology

## 2017-09-17 ENCOUNTER — Encounter: Payer: Self-pay | Admitting: Gastroenterology

## 2017-09-17 ENCOUNTER — Ambulatory Visit (INDEPENDENT_AMBULATORY_CARE_PROVIDER_SITE_OTHER): Payer: BLUE CROSS/BLUE SHIELD | Admitting: Gastroenterology

## 2017-09-17 ENCOUNTER — Encounter (INDEPENDENT_AMBULATORY_CARE_PROVIDER_SITE_OTHER): Payer: Self-pay

## 2017-09-17 VITALS — BP 120/78 | HR 76 | Ht 65.0 in | Wt 166.1 lb

## 2017-09-17 DIAGNOSIS — R109 Unspecified abdominal pain: Secondary | ICD-10-CM | POA: Diagnosis not present

## 2017-09-17 NOTE — Patient Instructions (Signed)
If you are age 38 or older, yo71ur body mass index should be between 23-30. Your Body mass index is 27.64 kg/m. If this is out of the aforementioned range listed, please consider follow up with your Primary Care Provider.  If you are age 38 or younger, your body mass index should be between 19-25. Your Body mass index is 27.64 kg/m. If this is out of the aformentioned range listed, please consider follow up with your Primary Care Provider.   Thank you,  Dr. Lynann Bolognaajesh Gupta

## 2017-09-17 NOTE — Progress Notes (Signed)
Chief Complaint: FU  Referring Provider:  Jonnie KindSaunders, Weston W, MD      ASSESSMENT AND PLAN;   #1. R flank pain with muscle tenderness -musculoskeletal in nature.  #2. Epigastric pain (resolved)-  US did show a small gallstone in the neck.  Normal CBC, CMP, lipase.  Has resolved on Protonix/carafate. Has asymptomatic cholelithiasis.  Plan: -Heating pads at night and BenGay/IcyHot in the morning. -Patient is to call us if she still has problems or any other problems in the future. She understands that she would get in touch with us in case she starts having any nausea/vomiting/abdominal pain/jaundice/fever or chills. -Can use ibuprofen on as needed basis. -Can stop Protonix.  She is already stopped Carafate. -She would like to hold off on surgical consultation for cholecystectomy.     HPI:    [name] is a 37 y.o. female  She has done very well Went on vacation, having right flank and right lateral pain which gets worse after moving. No significant back pain No nausea, vomiting, heartburn, regurgitation, odynophagia or dysphagia.  No significant diarrhea or constipation.  There is no melena or hematochezia. No unintentional weight loss. No epigastric or abdominal pain. She does not want to get referred to surgery - " does not want any type of surgery"   History reviewed. No pertinent past medical history.  Past Surgical History:  Procedure Laterality Date  . ESOPHAGOGASTRODUODENOSCOPY  10/21/2001   Normal EGD  . NASAL SINUS SURGERY  2015   removed a polyp. Dr Marisa SprinklesPhipps   . TONSILECTOMY/ADENOIDECTOMY WITH MYRINGOTOMY    . wisom teeth      Family History  Problem Relation Age of Onset  . Colon polyps Mother   . Colon polyps Father   . Colon cancer Neg Hx     Social History   Tobacco Use  . Smoking status: Never Smoker  . Smokeless tobacco: Never Used  Substance Use Topics  . Alcohol use: No  . Drug use: No    Current Outpatient Medications  Medication  Sig Dispense Refill  . guaiFENesin (MUCOSA PO) Take by mouth.    . pantoprazole (PROTONIX) 40 MG tablet TAKE 1 TABLET BY MOUTH DAILY 30 tablet 4  . Prenatal Vit-Fe Fumarate-FA (PRENATAL MULTIVITAMIN) TABS tablet Take 1 tablet by mouth daily at 12 noon.     . Probiotic Product (PROBIOTIC ADVANCED PO) Probiotic 20 billion cell capsule   1 capsule every day by oral route.    Marland Kitchen. VITAMIN K PO Take 40 mcg by mouth daily.    Marland Kitchen. dicyclomine (BENTYL) 10 MG capsule Take 1 capsule (10 mg total) by mouth 2 (two) times daily. (Patient not taking: Reported on 09/17/2017) 60 capsule 6   No current facility-administered medications for this visit.     Allergies  Allergen Reactions  . Penicillin G Hives  . Penicillins Rash    Has patient had a PCN reaction causing immediate rash, facial/tongue/throat swelling, SOB or lightheadedness with hypotension: No Has patient had a PCN reaction causing severe rash involving mucus membranes or skin necrosis: No Has patient had a PCN reaction that required hospitalization No Has patient had a PCN reaction occurring within the last 10 years: No If all of the above answers are "NO", then may proceed with Cephalosporin use.     Review of Systems:  Negative, no fever or chills No urinary problems     Physical Exam:    BP 120/78   Pulse 76   Ht 5'  5" (1.651 m)   Wt 166 lb 2 oz (75.4 kg)   BMI 27.64 kg/m  Filed Weights   09/17/17 0906  Weight: 166 lb 2 oz (75.4 kg)   Constitutional:  Well-developed, in no acute distress. Psychiatric: Normal mood and affect. Behavior is normal. HEENT: Pupils normal.  Conjunctivae are normal. No scleral icterus. Neck supple.  Cardiovascular: Normal rate, regular rhythm. No edema Pulmonary/chest: Effort normal and breath sounds normal. No wheezing, rales or rhonchi. Abdominal: Soft, nondistended.  Muscle tenderness reproducible R flank. Bowel sounds active throughout. There are no masses palpable. No hepatomegaly. Rectal:   defered Neurological: Alert and oriented to person place and time. Skin: Skin is warm and dry. No rashes noted.  Data Reviewed: I have personally reviewed following labs and imaging studies  CBC: CBC Latest Ref Rng & Units 04/12/2017 08/26/2015 08/25/2015  WBC 4.0 - 10.5 K/uL 12.8(H) 13.5(H) 11.7(H)  Hemoglobin 12.0 - 15.0 g/dL 40.9 11.7(L) 12.9  Hematocrit 36.0 - 46.0 % 39.1 34.9(L) 37.5  Platelets 150 - 400 K/uL 238 162 192    CMP: CMP Latest Ref Rng & Units 04/12/2017  Glucose 65 - 99 mg/dL 99  BUN 6 - 20 mg/dL 11  Creatinine 8.11 - 9.14 mg/dL 7.82  Sodium 956 - 213 mmol/L 139  Potassium 3.5 - 5.1 mmol/L 3.6  Chloride 101 - 111 mmol/L 107  CO2 22 - 32 mmol/L 25  Calcium 8.9 - 10.3 mg/dL 8.9  Total Protein 6.5 - 8.1 g/dL 6.5  Total Bilirubin 0.3 - 1.2 mg/dL 0.5  Alkaline Phos 38 - 126 U/L 84  AST 15 - 41 U/L 16  ALT 14 - 54 U/L 13(L)  I spent 15 minutes of face-to-face time with the patient. Greater than 50% of the time was spent counseling and coordinating care.    Edman Circle, MD 09/17/2017, 9:31 AM

## 2018-11-23 IMAGING — US US ABDOMEN LIMITED
1 series · 14 of 25 positions shown · non-contrast
Comparison: None.

CLINICAL DATA: Epigastric pain for several weeks

EXAM:
ULTRASOUND ABDOMEN LIMITED RIGHT UPPER QUADRANT

[Series 1: us abdomen limited · 0.18mm/px · 14 of 47 slices shown]
[im 1/47]
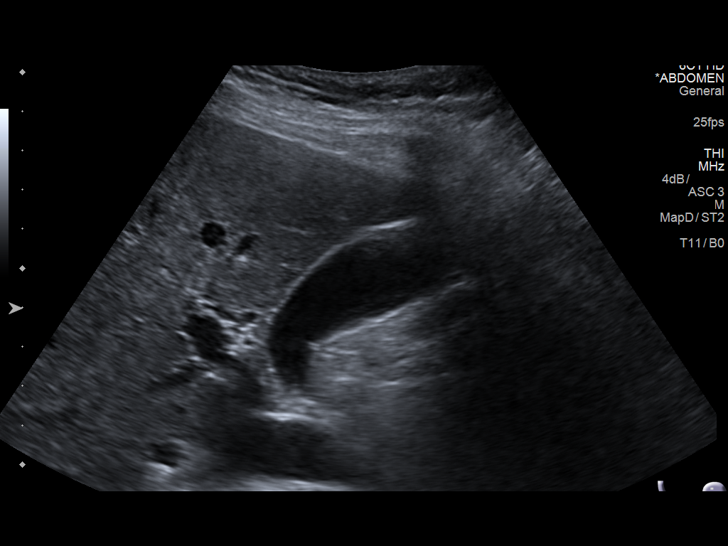
[im 4/47]
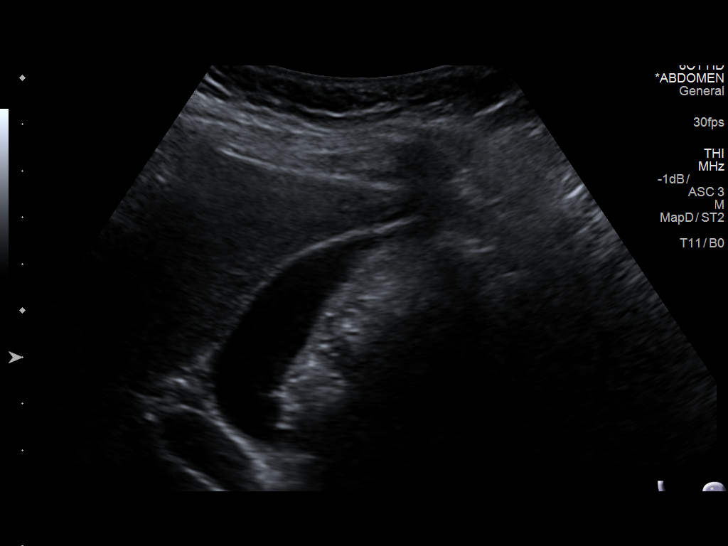
[im 8/47]
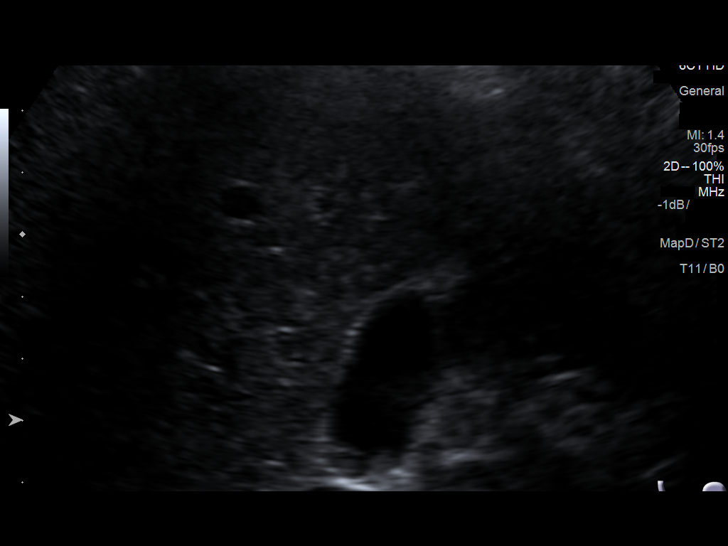
[im 12/47]
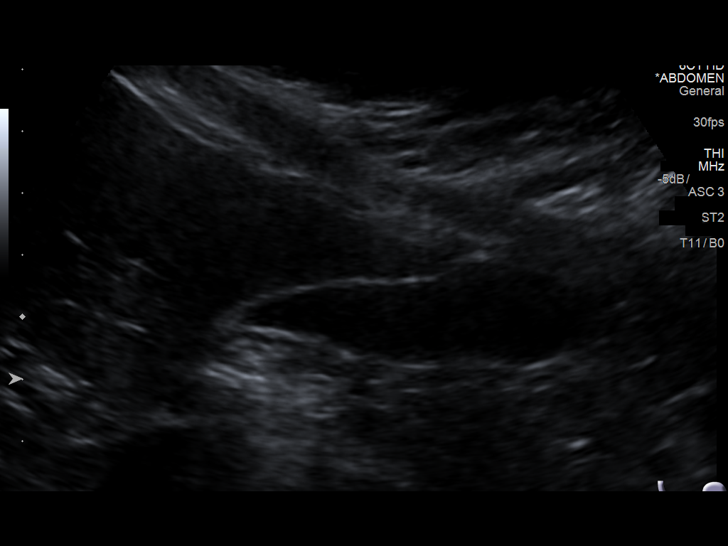
[im 16/47]
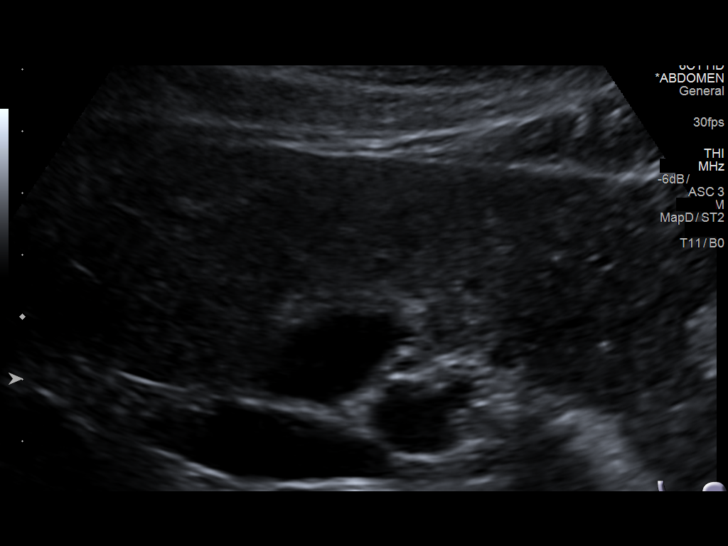
[im 18/47]
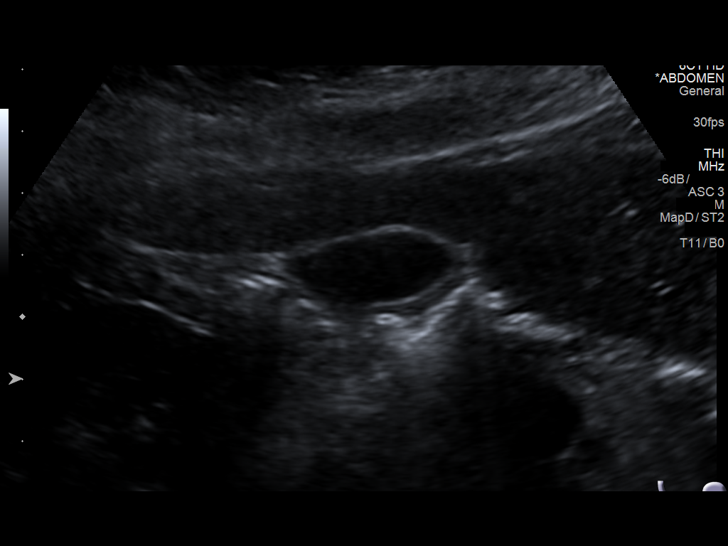
[im 22/47]
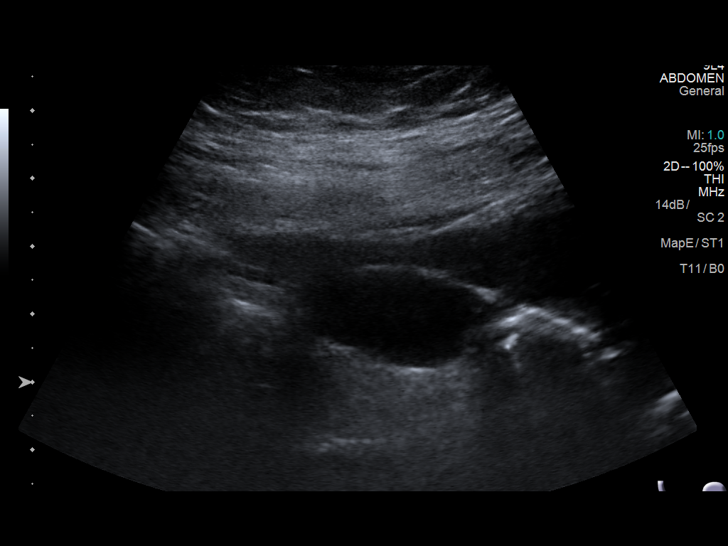
[im 25/47]
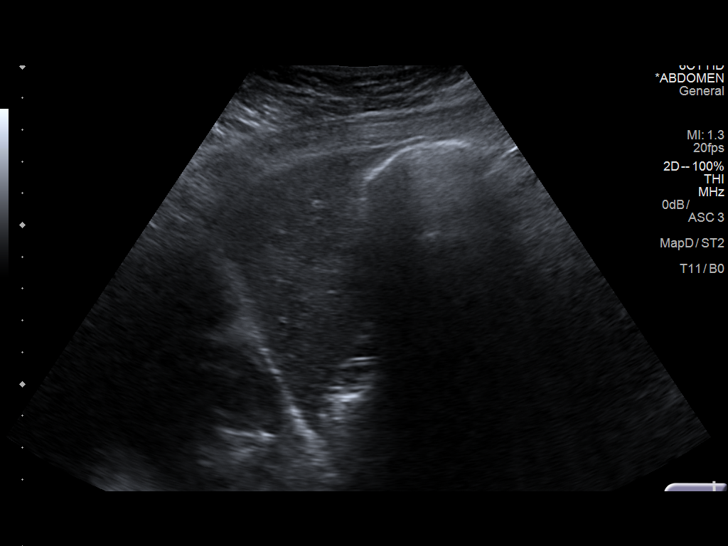
[im 29/47]
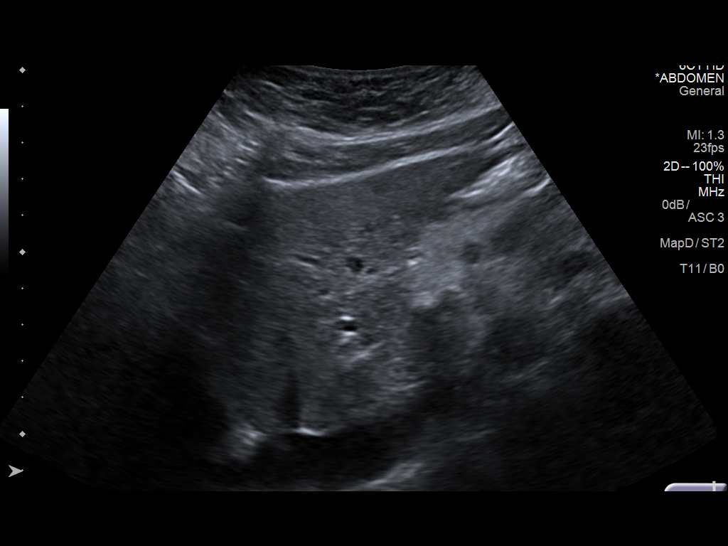
[im 31/47]
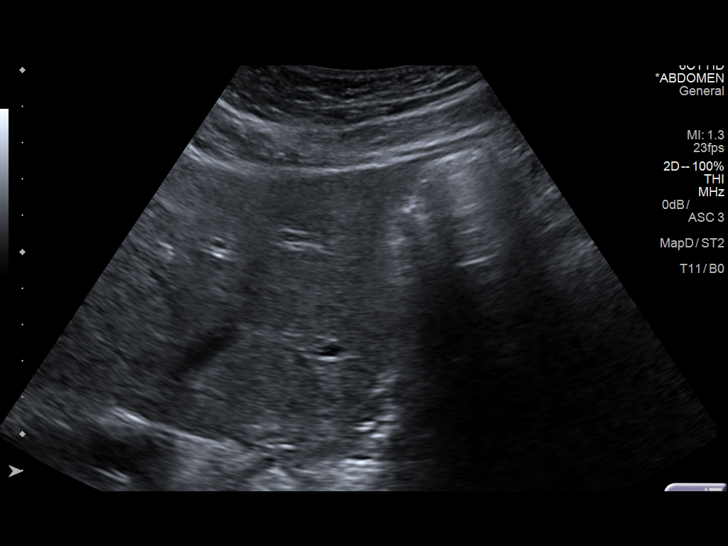
[im 35/47]
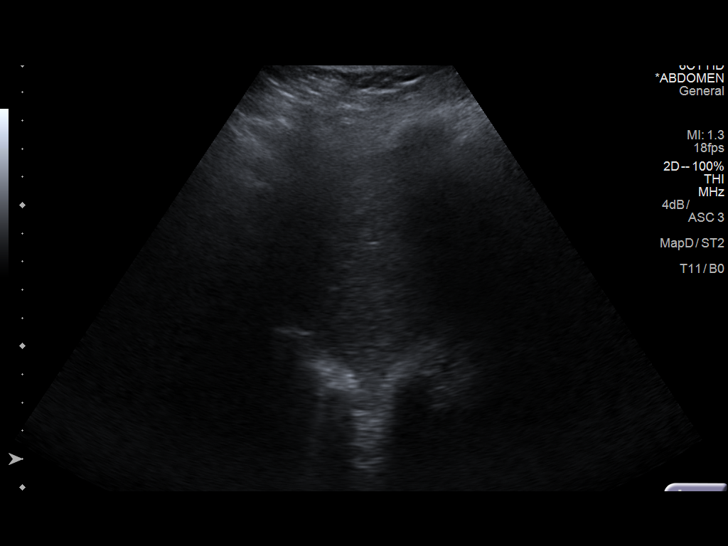
[im 39/47]
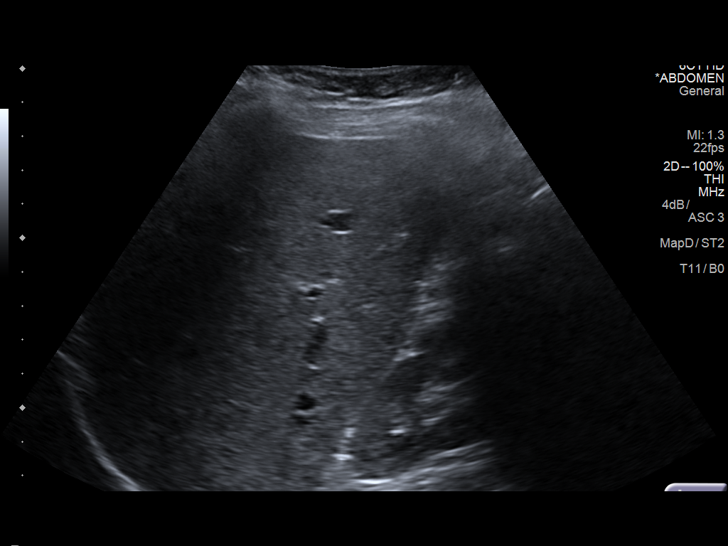
[im 43/47]
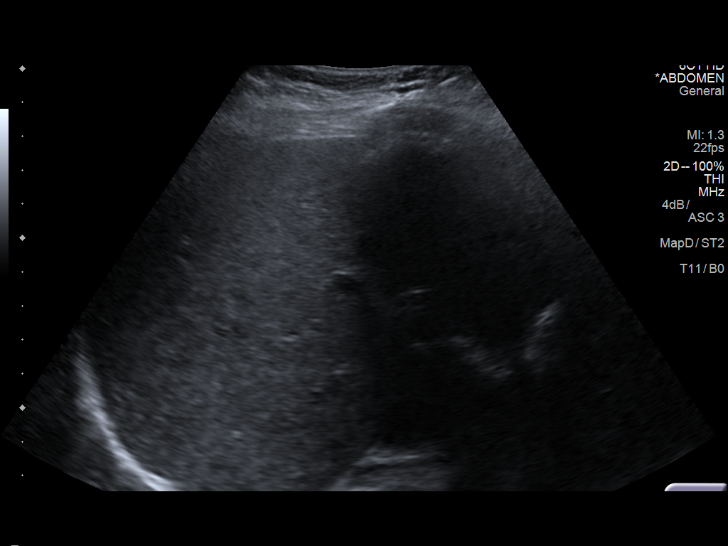
[im 47/47]
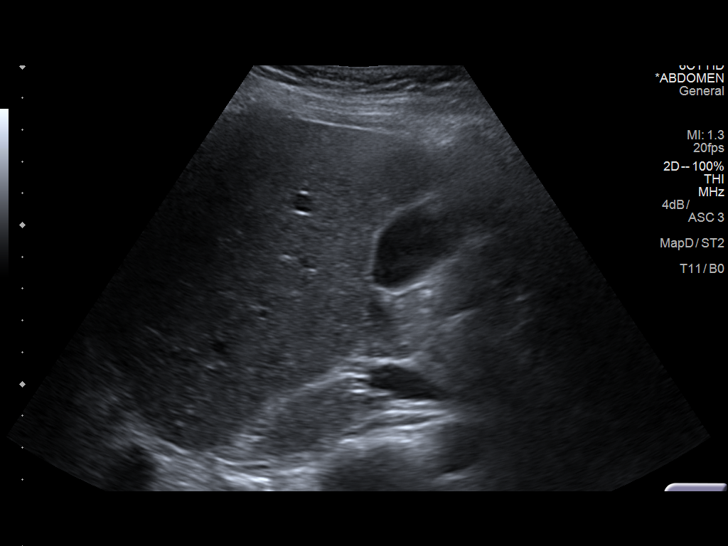

[14 of 25 positions shown; findings below may reference images not displayed]

FINDINGS: Gallbladder:

Gallstone is well distended. Tiny stone is noted in the region of
the gallbladder neck without complicating factors. Small polyp is
also noted.

Common bile duct:

Diameter: Mm.

Liver:

No focal lesion identified. Within normal limits in parenchymal
echogenicity. Small cyst is noted in the left lobe measuring less
than 1 cm. Portal vein is patent on color Doppler imaging with
normal direction of blood flow towards the liver.
IMPRESSION: Tiny gallstone in the region of the neck without complicating
factors. Small gallbladder polyp is noted as well.

Small hepatic cyst.
